# Patient Record
Sex: Female | Born: 1965 | Race: White | Hispanic: No | Marital: Married | Smoking: Current every day smoker
Health system: Southern US, Community
[De-identification: ages and names within clinical notes are randomized; demographics above are authoritative.]

## PROBLEM LIST (undated history)

## (undated) DIAGNOSIS — G8929 Other chronic pain: Secondary | ICD-10-CM

## (undated) DIAGNOSIS — M549 Dorsalgia, unspecified: Secondary | ICD-10-CM

## (undated) HISTORY — PX: TONSILLECTOMY: SUR1361

## (undated) HISTORY — PX: ABDOMINAL HYSTERECTOMY: SHX81

---

## 2004-04-19 ENCOUNTER — Ambulatory Visit: Payer: Self-pay | Admitting: Obstetrics and Gynecology

## 2006-07-07 ENCOUNTER — Ambulatory Visit (HOSPITAL_COMMUNITY): Admission: RE | Admit: 2006-07-07 | Discharge: 2006-07-07 | Payer: Self-pay | Admitting: Preventative Medicine

## 2007-05-26 ENCOUNTER — Emergency Department: Payer: Self-pay | Admitting: Emergency Medicine

## 2007-06-18 ENCOUNTER — Encounter: Payer: Self-pay | Admitting: Physician Assistant

## 2007-07-08 ENCOUNTER — Encounter: Payer: Self-pay | Admitting: Physician Assistant

## 2007-11-19 ENCOUNTER — Ambulatory Visit: Payer: Self-pay | Admitting: Family Medicine

## 2008-02-01 ENCOUNTER — Emergency Department: Payer: Self-pay | Admitting: Emergency Medicine

## 2008-02-01 ENCOUNTER — Other Ambulatory Visit: Payer: Self-pay

## 2015-07-28 ENCOUNTER — Emergency Department: Payer: PRIVATE HEALTH INSURANCE

## 2015-07-28 ENCOUNTER — Emergency Department
Admission: EM | Admit: 2015-07-28 | Discharge: 2015-07-29 | Disposition: A | Discharge status: Home / Self Care | Payer: PRIVATE HEALTH INSURANCE | Attending: Student | Admitting: Student

## 2015-07-28 DIAGNOSIS — R079 Chest pain, unspecified: Secondary | ICD-10-CM | POA: Insufficient documentation

## 2015-07-28 DIAGNOSIS — F17210 Nicotine dependence, cigarettes, uncomplicated: Secondary | ICD-10-CM | POA: Insufficient documentation

## 2015-07-28 DIAGNOSIS — M545 Low back pain: Secondary | ICD-10-CM | POA: Diagnosis not present

## 2015-07-28 DIAGNOSIS — R51 Headache: Secondary | ICD-10-CM | POA: Insufficient documentation

## 2015-07-28 DIAGNOSIS — R519 Headache, unspecified: Secondary | ICD-10-CM

## 2015-07-28 DIAGNOSIS — B349 Viral infection, unspecified: Secondary | ICD-10-CM | POA: Diagnosis not present

## 2015-07-28 DIAGNOSIS — R05 Cough: Secondary | ICD-10-CM | POA: Diagnosis not present

## 2015-07-28 DIAGNOSIS — R059 Cough, unspecified: Secondary | ICD-10-CM

## 2015-07-28 LAB — RAPID INFLUENZA A&B ANTIGENS
Influenza A (ARMC): NEGATIVE
Influenza B (ARMC): NEGATIVE

## 2015-07-28 LAB — URINALYSIS COMPLETE WITH MICROSCOPIC (ARMC ONLY)
BACTERIA UA: NONE SEEN
Bilirubin Urine: NEGATIVE
Glucose, UA: NEGATIVE mg/dL
Hgb urine dipstick: NEGATIVE
KETONES UR: NEGATIVE mg/dL
NITRITE: NEGATIVE
PH: 6 (ref 5.0–8.0)
PROTEIN: NEGATIVE mg/dL
SPECIFIC GRAVITY, URINE: 1.016 (ref 1.005–1.030)

## 2015-07-28 LAB — BASIC METABOLIC PANEL
ANION GAP: 8 (ref 5–15)
BUN: 16 mg/dL (ref 6–20)
CALCIUM: 9.6 mg/dL (ref 8.9–10.3)
CO2: 25 mmol/L (ref 22–32)
Chloride: 102 mmol/L (ref 101–111)
Creatinine, Ser: 0.9 mg/dL (ref 0.44–1.00)
Glucose, Bld: 93 mg/dL (ref 65–99)
POTASSIUM: 4.2 mmol/L (ref 3.5–5.1)
Sodium: 135 mmol/L (ref 135–145)

## 2015-07-28 LAB — CBC
HEMATOCRIT: 40.5 % (ref 35.0–47.0)
HEMOGLOBIN: 13.7 g/dL (ref 12.0–16.0)
MCH: 31.4 pg (ref 26.0–34.0)
MCHC: 33.8 g/dL (ref 32.0–36.0)
MCV: 92.7 fL (ref 80.0–100.0)
Platelets: 200 10*3/uL (ref 150–440)
RBC: 4.37 MIL/uL (ref 3.80–5.20)
RDW: 13.3 % (ref 11.5–14.5)
WBC: 9.7 10*3/uL (ref 3.6–11.0)

## 2015-07-28 LAB — CK: Total CK: 58 U/L (ref 38–234)

## 2015-07-28 MED ORDER — SODIUM CHLORIDE 0.9 % IV BOLUS (SEPSIS)
1000.0000 mL | Freq: Once | INTRAVENOUS | Status: AC
  Administered 2015-07-28: 1000 mL via INTRAVENOUS

## 2015-07-28 MED ORDER — BENZONATATE 100 MG PO CAPS: 100.0000 mg | ORAL_CAPSULE | Freq: Three times a day (TID) | ORAL | Status: AC | PRN

## 2015-07-28 MED ORDER — OXYCODONE-ACETAMINOPHEN 5-325 MG PO TABS
ORAL_TABLET | ORAL | Status: AC
  Administered 2015-07-28: 1 via ORAL
  Filled 2015-07-28: qty 1

## 2015-07-28 MED ORDER — KETOROLAC TROMETHAMINE 30 MG/ML IJ SOLN
15.0000 mg | Freq: Once | INTRAMUSCULAR | Status: AC
  Administered 2015-07-28: 15 mg via INTRAVENOUS
  Filled 2015-07-28: qty 1

## 2015-07-28 MED ORDER — DIPHENHYDRAMINE HCL 50 MG/ML IJ SOLN
12.5000 mg | Freq: Once | INTRAMUSCULAR | Status: AC
  Administered 2015-07-28: 12.5 mg via INTRAVENOUS
  Filled 2015-07-28: qty 1

## 2015-07-28 MED ORDER — OXYCODONE-ACETAMINOPHEN 5-325 MG PO TABS
1.0000 | ORAL_TABLET | ORAL | Status: AC | PRN
  Administered 2015-07-28 (×2): 1 via ORAL

## 2015-07-28 MED ORDER — METOCLOPRAMIDE HCL 5 MG/ML IJ SOLN
10.0000 mg | Freq: Once | INTRAMUSCULAR | Status: AC
  Administered 2015-07-28: 10 mg via INTRAVENOUS
  Filled 2015-07-28: qty 2

## 2015-07-28 NOTE — ED Notes (Addendum)
Pt transferred to CT via stretcher.  

## 2015-07-28 NOTE — ED Provider Notes (Signed)
[facility] Emergency Department Provider Note  ____________________________________________  Time seen: Approximately 9:40 PM  I have reviewed the triage vital signs and the nursing notes.   HISTORY  Chief Complaint Headache and Fatigue    HPI Kaitlyn Hart is a 50 y.o. female with no chronic medical problem 2 presents for evaluation of multiple complaints, gradual onset today, constant since onset, currently moderate to severe, no modifying factors. The patient reports that she awoke this morning with severe cough and nasal congestion. She now has chest pain with cough. The pain does not radiate into her neck, back or down towards the feet, is not exertional or pleuritic in nature. Her husband has been sick with upper respiratory tract infection symptoms. While she was at work, she was eating lunch when she "felt like I was floating". she did not feel lightheaded, did not faint, did not hit her head or lose consciousness but she overall felt very unwell and soon after developed left-sided headache which was not maximal at onset but is currently severe. She is complaining of myalgias including pain in bilateral legs and her back. She denies fevers but she has had chills. No associated throat. No abdominal pain, vomiting or diarrhea. No neck stiffness. She is having photophobia.   History reviewed. No pertinent past medical history.  There are no active problems to display for this patient.   Past Surgical History  Procedure Laterality Date  . Tonsillectomy    . Abdominal hysterectomy      Current Outpatient Rx  Name  Route  Sig  Dispense  Refill  . benzonatate (TESSALON PERLES) 100 MG capsule   Oral   Take 1 capsule (100 mg total) by mouth 3 (three) times daily as needed for cough.   15 capsule   0     Allergies Review of patient's allergies indicates no known allergies.  No family history on file.  Social History Social History  Substance Use  Topics  . Smoking status: Current Every Day Smoker    Types: Cigarettes  . Smokeless tobacco: None  . Alcohol Use: Yes    Review of Systems Constitutional: No fever, +chills Eyes: No visual changes. ENT: No sore throat. Cardiovascular: + chest pain with cough. Respiratory: Denies shortness of breath. Gastrointestinal: No abdominal pain.  No nausea, no vomiting.  No diarrhea.  No constipation. Genitourinary: Negative for dysuria. Musculoskeletal: Positive for back pain. Skin: Negative for rash. Neurological: Positive for headaches, no focal weakness or numbness.  10-point ROS otherwise negative.  ____________________________________________   PHYSICAL EXAM:  VITAL SIGNS: ED Triage Vitals  Enc Vitals Group     BP 07/28/15 1547 123/67 mmHg     Pulse Rate 07/28/15 1547 86     Resp 07/28/15 1547 18     Temp 07/28/15 1547 98.1 F (36.7 C)     Temp Source 07/28/15 1547 Oral     SpO2 07/28/15 1547 100 %     Weight 07/28/15 1547 170 lb (77.111 kg)     Height 07/28/15 1547  (1.651 m)     Head Cir --      Peak Flow --      Pain Score 07/28/15 1553 7     Pain Loc --      Pain Edu? --      Excl. in GC? --     Constitutional: Alert and oriented. Fatigued but nontoxic appearing and in no acute distress. + frequent dry cough Eyes: Conjunctivae are normal.  PERRL. EOMI. + mild photophobia Head: Atraumatic. Nose: No congestion/rhinnorhea. Mouth/Throat: Mucous membranes are moist.  Oropharynx non-erythematous. Neck: No stridor.  Supple without meningismus. Cardiovascular: Normal rate, regular rhythm. Grossly normal heart sounds.  Good peripheral circulation. Respiratory: Normal respiratory effort.  No retractions. Lungs CTAB. Gastrointestinal: Soft and nontender. No distention.  No CVA tenderness. Genitourinary:  deferred Musculoskeletal: No lower extremity tenderness nor edema.  No joint effusions. No calf swelling, asymmetry, or tenderness. Neurologic:  Normal speech and  language. No gross focal neurologic deficits are appreciated. 5 out of 5 strength in bilateral upper and lower extremities, sensation intact to light touch throughout, cranial nerves II through XII intact. Skin:  Skin is warm, dry and intact. No rash noted. Psychiatric: Mood and affect are normal. Speech and behavior are normal.  ____________________________________________   LABS (all labs ordered are listed, but only abnormal results are displayed)  Labs Reviewed  URINALYSIS COMPLETEWITH MICROSCOPIC ([facility] ONLY) - Abnormal; Notable for the following:    Color, Urine YELLOW (*)    APPearance CLEAR (*)    Leukocytes, UA TRACE (*)    Squamous Epithelial / LPF 0-5 (*)    All other components within normal limits  RAPID INFLUENZA A&B ANTIGENS ([facility] ONLY)  BASIC METABOLIC PANEL  CBC  CK  CBG MONITORING, ED   ____________________________________________  EKG  ED ECG REPORT I, Gayla DossGayle, Kaliope Quinonez A, the attending physician, personally viewed and interpreted this ECG.   Date: 07/28/2015  EKG Time: 15:53  Rate: 87  Rhythm: normal EKG, normal sinus rhythm  Axis: normal  Intervals:none  ST&T Change: No acute ST elevation.  ____________________________________________  RADIOLOGY  CT head IMPRESSION: No acute intracranial pathology.   CXR IMPRESSION: No active cardiopulmonary disease.  ____________________________________________   PROCEDURES  Procedure(s) performed: None  Critical Care performed: No  ____________________________________________   INITIAL IMPRESSION / ASSESSMENT AND PLAN / ED COURSE  Pertinent labs & imaging results that were available during my care of the patient were reviewed by me and considered in my medical decision making (see chart for details).  Kaitlyn Hart is a 50 y.o. female with no chronic medical problem 2 presents for evaluation of multiple complaints in the setting of likely viral syndrome. On exam she appears nontoxic but she does  have mild photophobia. She has an intact neurological exam. Neck is supple and I doubt meningismus. Her most salient complaint is of left sided headache and she does not have any history of headaches. We'll send basic labs, obtain CT head, chest x-ray and treat with migraine cocktail. Reassess for disposition. EKG reassuring and I suspect her chest pain with cough is musculoskeletal in nature. Not consistent with acute aortic dissection or PE.  ----------------------------------------- 11:44 PM on 07/28/2015 ----------------------------------------- Labs reviewed. CBC, BMP, CK unremarkable. Negative flu however suspect the patient's symptoms today are secondary to flulike illness. Urinalysis not consistent with infection. CT head and chest x-ray showed no acute pathology. Patient reports that her headache has significantly improved at this time. Doubt subarachnoid hemorrhage. Prior to migraine cocktail she rated pain at 9 out of 10. She now reports that it is a 1 out of 10.  She reports she  Feels much better. Her vital signs are stable. We discussed symptomatic supportive care, return precautions, need for close follow-up and she is comfortable with the discharge plan. DC home.  ____________________________________________   FINAL CLINICAL IMPRESSION(S) / ED DIAGNOSES  Final diagnoses:  Viral syndrome  Acute nonintractable headache, unspecified headache type  Cough  Gayla Doss, MD 07/28/15 [phone]

## 2015-07-28 NOTE — ED Notes (Signed)
Called lab to add on CK. They stated they would run the CK.

## 2015-07-28 NOTE — ED Notes (Signed)
Pt c/o sudden onset HA with photophobia while eating Lunch at work, states just before the HA she had diaphoresis.. C/o chills\sweats with nausea...  Denies vomiting, numbness or dizziness.. Denies Hx of migraines

## 2015-07-28 NOTE — ED Notes (Signed)
Pt reports a sudden headache around 1330, pt reports sudden onset of generalized weakness with tremors

## 2015-07-28 NOTE — ED Notes (Addendum)
Pt states "I got sick at work, I felt like I was floating." Husband states she didn't black out but she doesn't remember how how she got back to the room from lunch. Pt states sweats, nauseous  But no vomiting. Pt states headache, denies fall. Pt denies fever. Pt states husband in room is sick. Pt states cough, back and chest pain along with headache. February 20th pt had shingles.

## 2016-03-31 ENCOUNTER — Emergency Department: Payer: Worker's Compensation

## 2016-03-31 ENCOUNTER — Encounter: Payer: Self-pay | Admitting: *Deleted

## 2016-03-31 DIAGNOSIS — S60212A Contusion of left wrist, initial encounter: Secondary | ICD-10-CM | POA: Diagnosis not present

## 2016-03-31 DIAGNOSIS — S6992XA Unspecified injury of left wrist, hand and finger(s), initial encounter: Secondary | ICD-10-CM | POA: Diagnosis present

## 2016-03-31 DIAGNOSIS — S80212A Abrasion, left knee, initial encounter: Secondary | ICD-10-CM | POA: Insufficient documentation

## 2016-03-31 DIAGNOSIS — W010XXA Fall on same level from slipping, tripping and stumbling without subsequent striking against object, initial encounter: Secondary | ICD-10-CM | POA: Insufficient documentation

## 2016-03-31 DIAGNOSIS — Y9259 Other trade areas as the place of occurrence of the external cause: Secondary | ICD-10-CM | POA: Insufficient documentation

## 2016-03-31 DIAGNOSIS — Y9389 Activity, other specified: Secondary | ICD-10-CM | POA: Diagnosis not present

## 2016-03-31 DIAGNOSIS — F17210 Nicotine dependence, cigarettes, uncomplicated: Secondary | ICD-10-CM | POA: Insufficient documentation

## 2016-03-31 DIAGNOSIS — Y990 Civilian activity done for income or pay: Secondary | ICD-10-CM | POA: Diagnosis not present

## 2016-03-31 NOTE — ED Triage Notes (Signed)
Pt has left wrist and left knee pain after falling at work tonight at [employer].  Pt has abrasion to left knee   States painful to ambulate.  Pt states WC.  Pt alert.

## 2016-04-01 ENCOUNTER — Emergency Department
Admission: EM | Admit: 2016-04-01 | Discharge: 2016-04-01 | Disposition: A | Discharge status: Home / Self Care | Payer: Worker's Compensation | Attending: Emergency Medicine | Admitting: Emergency Medicine

## 2016-04-01 DIAGNOSIS — S80212A Abrasion, left knee, initial encounter: Secondary | ICD-10-CM

## 2016-04-01 DIAGNOSIS — S60212A Contusion of left wrist, initial encounter: Secondary | ICD-10-CM

## 2016-04-01 MED ORDER — LIDOCAINE-EPINEPHRINE-TETRACAINE (LET) SOLUTION
NASAL | Status: AC
  Filled 2016-04-01: qty 3

## 2016-04-01 MED ORDER — LIDOCAINE VISCOUS 2 % MT SOLN
15.0000 mL | Freq: Once | OROMUCOSAL | Status: AC
  Administered 2016-04-01: 15 mL via OROMUCOSAL

## 2016-04-01 MED ORDER — LIDOCAINE VISCOUS 2 % MT SOLN
OROMUCOSAL | Status: AC
  Administered 2016-04-01: 15 mL via OROMUCOSAL
  Filled 2016-04-01: qty 15

## 2016-04-01 MED ORDER — OXYCODONE-ACETAMINOPHEN 5-325 MG PO TABS
ORAL_TABLET | ORAL | Status: AC
  Administered 2016-04-01: 1 via ORAL
  Filled 2016-04-01: qty 1

## 2016-04-01 MED ORDER — OXYCODONE-ACETAMINOPHEN 5-325 MG PO TABS: 1.0000 | ORAL_TABLET | ORAL | 0 refills | Status: AC | PRN

## 2016-04-01 MED ORDER — OXYCODONE-ACETAMINOPHEN 5-325 MG PO TABS
1.0000 | ORAL_TABLET | Freq: Once | ORAL | Status: AC
  Administered 2016-04-01: 1 via ORAL

## 2016-04-01 NOTE — ED Notes (Signed)
Pt had fall at work to left side, abrasion noted to left knee, pt holding left wrist. No deformity ntoed.

## 2016-04-01 NOTE — ED Provider Notes (Signed)
[facility] Emergency Department Provider Note    First MD Initiated Contact with Patient 04/01/16 [phone]     (approximate)  I have reviewed the triage vital signs and the nursing notes.   HISTORY  Chief Complaint Wrist Pain and Fall   HPI Kaitlyn Hart is a 50 y.o. female presents with history of slip and fall while at work [employer]. Patient complains of resultant left wrist and left knee pain that is currently 9 out of 10. Patient admits to abrasion to the left knee. Pain is worse with ambulation. Patient states that left wrist pain is worsened with movement.   Past medical history No pertinent past medical history There are no active problems to display for this patient.   Past Surgical History:  Procedure Laterality Date  . ABDOMINAL HYSTERECTOMY    . TONSILLECTOMY      Prior to Admission medications   Medication Sig Start Date End Date Taking? Authorizing Provider  benzonatate (TESSALON PERLES) 100 MG capsule Take 1 capsule (100 mg total) by mouth 3 (three) times daily as needed for cough. 07/28/15   Gayla DossEryka A Gayle, MD    Allergies No known drug allergies No family history on file.  Social History Social History  Substance Use Topics  . Smoking status: Current Every Day Smoker    Types: Cigarettes  . Smokeless tobacco: Never Used  . Alcohol use Yes    Review of Systems Constitutional: No fever/chills Eyes: No visual changes. ENT: No sore throat. Cardiovascular: Denies chest pain. Respiratory: Denies shortness of breath. Gastrointestinal: No abdominal pain.  No nausea, no vomiting.  No diarrhea.  No constipation. Genitourinary: Negative for dysuria. Musculoskeletal: Negative for back pain.Positive for left wrist and knee pain Skin: Negative for rash. Neurological: Negative for headaches, focal weakness or numbness.  10-point ROS otherwise negative.  ____________________________________________   PHYSICAL  EXAM:  VITAL SIGNS: ED Triage Vitals  Enc Vitals Group     BP 03/31/16 2323 (!) 160/90     Pulse Rate 03/31/16 2323 84     Resp 03/31/16 2323 20     Temp 03/31/16 2323 98.7 F (37.1 C)     Temp Source 03/31/16 2323 Oral     SpO2 03/31/16 2323 99 %     Weight 03/31/16 2324 175 lb (79.4 kg)     Height 03/31/16 2324 5\' 5"  (1.651 m)     Head Circumference --      Peak Flow --      Pain Score 03/31/16 2324 8     Pain Loc --      Pain Edu? --      Excl. in GC? --     Constitutional: Alert and oriented. Apparent discomfort Eyes: Conjunctivae are normal. PERRL. EOMI. Head: Atraumatic. Ears:  Healthy appearing ear canals and TMs bilaterally Nose: No congestion/rhinnorhea. Mouth/Throat: Mucous membranes are moist.  Oropharynx non-erythematous. Neck: No stridor.   Cardiovascular: Normal rate, regular rhythm. Good peripheral circulation. Grossly normal heart sounds. Respiratory: Normal respiratory effort.  No retractions. Lungs CTAB. Gastrointestinal: Soft and nontender. No distention.  Musculoskeletal: No pain with active and passive range of motion of the left knee. Pain with active and passive range of motion in all directions of the left wrist. Neurologic:  Normal speech and language. No gross focal neurologic deficits are appreciated.  Skin:  Quarter size abrasion to the patellar region of the left knee Psychiatric: Mood and affect are normal. Speech and behavior are normal.  RADIOLOGY I, [place] N Ivar Domangue, personally viewed and evaluated these images (plain radiographs) as part of my medical decision making, as well as reviewing the written report by the radiologist.  Dg Wrist Complete Left  Result Date: 04/01/2016 CLINICAL DATA:  Left wrist and left knee pain after a fall tonight. EXAM: LEFT WRIST - COMPLETE 3+ VIEW COMPARISON:  None. FINDINGS: There is no evidence of fracture or dislocation. There is no evidence of arthropathy or other focal bone abnormality. Soft tissues are  unremarkable. IMPRESSION: Negative. Electronically Signed   By: Burman NievesWilliam  Stevens M.D.   On: 04/01/2016 00:17   Dg Knee Complete 4 Views Left  Result Date: 04/01/2016 CLINICAL DATA:  Left knee pain after a fall today. EXAM: LEFT KNEE - COMPLETE 4+ VIEW COMPARISON:  None. FINDINGS: No evidence of fracture, dislocation, or joint effusion. No evidence of arthropathy or other focal bone abnormality. Soft tissues are unremarkable. IMPRESSION: Negative. Electronically Signed   By: Burman NievesWilliam  Stevens M.D.   On: 04/01/2016 00:18     Procedures    INITIAL IMPRESSION / ASSESSMENT AND PLAN / ED COURSE  Pertinent labs & imaging results that were available during my care of the patient were reviewed by me and considered in my medical decision making (see chart for details).     Clinical Course as of Apr 02 203  Fri Apr 01, 2016  0029 DG Wrist Complete Left [RB]    Clinical Course User Index [RB] Darci Currentandolph N Kathia Covington, MD    ____________________________________________  FINAL CLINICAL IMPRESSION(S) / ED DIAGNOSES  Final diagnoses:  Contusion of left wrist, initial encounter  Abrasion of left knee, initial encounter     MEDICATIONS GIVEN DURING THIS VISIT:  Medications  lidocaine-EPINEPHrine-tetracaine (LET) solution (not administered)  oxyCODONE-acetaminophen (PERCOCET/ROXICET) 5-325 MG per tablet 1 tablet (1 tablet Oral Given 04/01/16 0203)  lidocaine (XYLOCAINE) 2 % viscous mouth solution 15 mL (15 mLs Mouth/Throat Given 04/01/16 0203)     NEW OUTPATIENT MEDICATIONS STARTED DURING THIS VISIT:  New Prescriptions   No medications on file    Modified Medications   No medications on file    Discontinued Medications   No medications on file     Note:  This document was prepared using Dragon voice recognition software and may include unintentional dictation errors.    Darci Currentandolph N Daneka Lantigua, MD 04/01/16 [phone]

## 2016-05-06 ENCOUNTER — Other Ambulatory Visit: Payer: Self-pay | Admitting: Surgery

## 2016-05-06 DIAGNOSIS — S83232D Complex tear of medial meniscus, current injury, left knee, subsequent encounter: Secondary | ICD-10-CM

## 2016-05-11 ENCOUNTER — Ambulatory Visit: Payer: PRIVATE HEALTH INSURANCE

## 2016-05-24 ENCOUNTER — Ambulatory Visit
Admission: RE | Admit: 2016-05-24 | Discharge: 2016-05-24 | Disposition: A | Discharge status: Home / Self Care | Payer: Worker's Compensation | Source: Ambulatory Visit | Attending: Surgery | Admitting: Surgery

## 2016-05-24 DIAGNOSIS — S838X2D Sprain of other specified parts of left knee, subsequent encounter: Secondary | ICD-10-CM | POA: Diagnosis present

## 2016-05-24 DIAGNOSIS — X58XXXD Exposure to other specified factors, subsequent encounter: Secondary | ICD-10-CM | POA: Insufficient documentation

## 2016-05-24 DIAGNOSIS — S83232D Complex tear of medial meniscus, current injury, left knee, subsequent encounter: Secondary | ICD-10-CM

## 2016-07-25 ENCOUNTER — Other Ambulatory Visit: Payer: Self-pay | Admitting: Surgery

## 2016-07-25 DIAGNOSIS — S53402D Unspecified sprain of left elbow, subsequent encounter: Secondary | ICD-10-CM

## 2016-07-31 ENCOUNTER — Ambulatory Visit
Admission: RE | Admit: 2016-07-31 | Discharge: 2016-07-31 | Disposition: A | Discharge status: Home / Self Care | Payer: Worker's Compensation | Source: Ambulatory Visit | Attending: Surgery | Admitting: Surgery

## 2016-07-31 DIAGNOSIS — S53402D Unspecified sprain of left elbow, subsequent encounter: Secondary | ICD-10-CM

## 2016-09-13 ENCOUNTER — Telehealth: Payer: Self-pay | Admitting: Surgery

## 2016-09-13 ENCOUNTER — Other Ambulatory Visit: Payer: Self-pay | Admitting: Surgery

## 2016-09-14 NOTE — Telephone Encounter (Signed)
Error. Wrong patient.

## 2016-09-29 NOTE — Progress Notes (Signed)
This letter was cancelled as I completed it through my office EMR system.

## 2016-11-03 ENCOUNTER — Other Ambulatory Visit (HOSPITAL_COMMUNITY): Payer: Self-pay | Admitting: Surgery

## 2016-11-03 DIAGNOSIS — S83412S Sprain of medial collateral ligament of left knee, sequela: Secondary | ICD-10-CM

## 2016-11-08 ENCOUNTER — Other Ambulatory Visit (HOSPITAL_COMMUNITY): Payer: Self-pay | Admitting: Surgery

## 2016-11-08 DIAGNOSIS — M5417 Radiculopathy, lumbosacral region: Secondary | ICD-10-CM

## 2016-11-10 ENCOUNTER — Ambulatory Visit (HOSPITAL_COMMUNITY)
Admission: RE | Admit: 2016-11-10 | Discharge: 2016-11-10 | Disposition: A | Discharge status: Home / Self Care | Payer: Worker's Compensation | Source: Ambulatory Visit | Attending: Surgery | Admitting: Surgery

## 2016-11-10 DIAGNOSIS — M5136 Other intervertebral disc degeneration, lumbar region: Secondary | ICD-10-CM | POA: Insufficient documentation

## 2016-11-10 DIAGNOSIS — M25462 Effusion, left knee: Secondary | ICD-10-CM | POA: Diagnosis present

## 2016-11-10 DIAGNOSIS — M5417 Radiculopathy, lumbosacral region: Secondary | ICD-10-CM

## 2016-11-10 DIAGNOSIS — X58XXXS Exposure to other specified factors, sequela: Secondary | ICD-10-CM | POA: Insufficient documentation

## 2016-11-10 DIAGNOSIS — S83412S Sprain of medial collateral ligament of left knee, sequela: Secondary | ICD-10-CM | POA: Insufficient documentation

## 2016-11-10 DIAGNOSIS — M47816 Spondylosis without myelopathy or radiculopathy, lumbar region: Secondary | ICD-10-CM | POA: Diagnosis not present

## 2018-07-28 ENCOUNTER — Other Ambulatory Visit: Payer: Self-pay

## 2018-07-28 ENCOUNTER — Encounter (HOSPITAL_COMMUNITY): Payer: Self-pay | Admitting: Emergency Medicine

## 2018-07-28 ENCOUNTER — Emergency Department (HOSPITAL_COMMUNITY)
Admission: EM | Admit: 2018-07-28 | Discharge: 2018-07-28 | Disposition: A | Discharge status: Home / Self Care | Payer: Self-pay | Attending: Emergency Medicine | Admitting: Emergency Medicine

## 2018-07-28 DIAGNOSIS — M5432 Sciatica, left side: Secondary | ICD-10-CM | POA: Insufficient documentation

## 2018-07-28 DIAGNOSIS — F17210 Nicotine dependence, cigarettes, uncomplicated: Secondary | ICD-10-CM | POA: Insufficient documentation

## 2018-07-28 DIAGNOSIS — M5431 Sciatica, right side: Secondary | ICD-10-CM | POA: Insufficient documentation

## 2018-07-28 HISTORY — DX: Dorsalgia, unspecified: M54.9

## 2018-07-28 HISTORY — DX: Other chronic pain: G89.29

## 2018-07-28 MED ORDER — KETOROLAC TROMETHAMINE 60 MG/2ML IM SOLN
30.0000 mg | Freq: Once | INTRAMUSCULAR | Status: AC
  Administered 2018-07-28: 30 mg via INTRAMUSCULAR
  Filled 2018-07-28: qty 2

## 2018-07-28 MED ORDER — PREDNISONE 50 MG PO TABS
60.0000 mg | ORAL_TABLET | Freq: Once | ORAL | Status: AC
  Administered 2018-07-28: 60 mg via ORAL
  Filled 2018-07-28: qty 1

## 2018-07-28 MED ORDER — PREDNISONE 20 MG PO TABS: ORAL_TABLET | ORAL | 0 refills | Status: AC

## 2018-07-28 MED ORDER — IBUPROFEN 600 MG PO TABS: 600.0000 mg | ORAL_TABLET | Freq: Four times a day (QID) | ORAL | 0 refills | Status: DC | PRN

## 2018-07-28 MED ORDER — CYCLOBENZAPRINE HCL 10 MG PO TABS: 10.0000 mg | ORAL_TABLET | Freq: Two times a day (BID) | ORAL | 0 refills | Status: DC | PRN

## 2018-07-28 MED ORDER — CYCLOBENZAPRINE HCL 10 MG PO TABS: 10.0000 mg | ORAL_TABLET | Freq: Two times a day (BID) | ORAL | 0 refills | Status: AC | PRN

## 2018-07-28 MED ORDER — IBUPROFEN 600 MG PO TABS: 600.0000 mg | ORAL_TABLET | Freq: Four times a day (QID) | ORAL | 0 refills | Status: AC | PRN

## 2018-07-28 NOTE — ED Provider Notes (Signed)
[facility] EMERGENCY DEPARTMENT Provider Note   CSN: [id number] Arrival date & time: 07/28/18  1400    History   Chief Complaint Chief Complaint  Patient presents with  . Back Pain    HPI Kaitlyn Hart is a 53 y.o. female.     The history is provided by the patient and medical records. No language interpreter was used.    53 year old female with history of chronic back pain presenting complaining of worsening back pain.  Patient report for the past 2 to 3 years she has had recurrent lower back pain with pain radiates down to her left leg down to the foot.  She has been receiving several treatments labs as well as follow-up with the pain clinic.  She does have a neurosurgeon who had a recent MRI of her spine.  She is scheduled to have some injection to her spine to alleviate his symptoms.  For the past 3 to 4 days she endorsed worsening pain now radiates to her right buttock.  Pain is sharp, shooting, worsening with certain position and with sitting.  Pain is moderate to severe keeping her up at night.  She does not complain of any associated fever or chills, no bowel or bladder incontinence or saddle anesthesia.  No history of IV drug use active cancer.  She was prescribed gabapentin to use as needed which has not helped.  She did recall having to drive while sitting in an awkward position for an extended period of time which aggravates her back.  Past Medical History:  Diagnosis Date  . Chronic back pain greater than 3 months duration     There are no active problems to display for this patient.   Past Surgical History:  Procedure Laterality Date  . ABDOMINAL HYSTERECTOMY    . TONSILLECTOMY       OB History   No obstetric history on file.      Home Medications    Prior to Admission medications   Medication Sig Start Date End Date Taking? Authorizing Provider  benzonatate (TESSALON PERLES) 100 MG capsule Take 1 capsule (100 mg total) by mouth 3 (three) times daily  as needed for cough. 07/28/15   Gayla Doss, MD  oxyCODONE-acetaminophen (ROXICET) 5-325 MG tablet Take 1 tablet by mouth every 4 (four) hours as needed for severe pain. 04/01/16   Darci Current, MD    Family History No family history on file.  Social History Social History   Tobacco Use  . Smoking status: Current Every Day Smoker    Packs/day: 1.00    Types: Cigarettes  . Smokeless tobacco: Never Used  Substance Use Topics  . Alcohol use: Yes  . Drug use: Not on file     Allergies   Patient has no known allergies.   Review of Systems Review of Systems  All other systems reviewed and are negative.    Physical Exam Updated Vital Signs BP (!) 149/83 (BP Location: Right Arm)   Pulse 89   Temp 98.1 F (36.7 C) (Oral)   Resp 16   Ht 5\' 5"  (1.651 m)   Wt 97.5 kg   SpO2 100%   BMI 35.78 kg/m   Physical Exam Vitals signs and nursing note reviewed.  Constitutional:      General: She is not in acute distress.    Appearance: She is well-developed.  HENT:     Head: Atraumatic.  Eyes:     Conjunctiva/sclera: Conjunctivae normal.  Neck:  Musculoskeletal: Neck supple.  Cardiovascular:     Rate and Rhythm: Normal rate and regular rhythm.  Pulmonary:     Effort: Pulmonary effort is normal.     Breath sounds: Normal breath sounds.  Abdominal:     Palpations: Abdomen is soft.     Tenderness: There is no abdominal tenderness.  Musculoskeletal:        General: Tenderness (Tenderness along lumbar spine as well as bilateral paraspinal muscle on palpation.  Tenderness to both lumbosacral region on palpation.  Positive straight leg raise bilaterally) present.  Skin:    Findings: No rash.  Neurological:     Mental Status: She is alert.     Comments: Patellar deep tendon reflex intact bilaterally no foot drops.  Able to ambulate.      ED Treatments / Results  Labs (all labs ordered are listed, but only abnormal results are displayed) Labs Reviewed - No data  to display  EKG None  Radiology No results found.  Procedures Procedures (including critical care time)  Medications Ordered in ED Medications  ketorolac (TORADOL) injection 30 mg (30 mg Intramuscular Given 07/28/18 1531)  predniSONE (DELTASONE) tablet 60 mg (60 mg Oral Given 07/28/18 1530)     Initial Impression / Assessment and Plan / ED Course  I have reviewed the triage vital signs and the nursing notes.  Pertinent labs & imaging results that were available during my care of the patient were reviewed by me and considered in my medical decision making (see chart for details).        BP (!) 149/83 (BP Location: Right Arm)   Pulse 89   Temp 98.1 F (36.7 C) (Oral)   Resp 16   Ht 5\' 5"  (1.651 m)   Wt 97.5 kg   SpO2 100%   BMI 35.78 kg/m    Final Clinical Impressions(s) / ED Diagnoses   Final diagnoses:  Bilateral sciatica    ED Discharge Orders         Ordered    predniSONE (DELTASONE) 20 MG tablet     07/28/18 1548    cyclobenzaprine (FLEXERIL) 10 MG tablet  2 times daily PRN     07/28/18 1548    ibuprofen (ADVIL,MOTRIN) 600 MG tablet  Every 6 hours PRN     07/28/18 1548         Patient presenting with acute onset back pain. No evidence for cauda equina, spinal infection, bony injury, other significant pathology.  The patient did not have any urinary incontinence, bowel incontinence, saddle anesthesia, fever, or weight loss that would warrant imaging. Patient was provided with toradol and prednisone in ED.   Provided prescription for pain medication and muscle relaxant. Patient was advised to followup with primary physician in 3-7 days if continues to have back pain. Advised to return to ER if signs of cauda equina or spinal infection including loss of bowel or bladder function, peripheral numbness/weakness/tingling, significant fevers, or other concerns. At time of discharge patient able to ambulate and vitals stable.  Impression: Back pain   Plan:     * Discharge from ED   *  Advised Ibuprofen 600-800mg  q12-16hr prn for pain and inflammation.      * Prescribed Flexeril 10mg  TID PRN Spasm   * Advised Pt on supportive therapies, including resting on a firm mattress (on back w/ knees raised on a pillow or on side w/ knees bent at 45-90deg), professional massage, relaxation techniques, heat application for 20-85min q4-5hr, Wt loss, ergonomic  therapies, and refraining from lifting heavy objects.   * Instructed Pt on low back pain ROM exercises.   * F/U with PCP in 3-7 days if pain continues.  Informed to return to ER if has new or worsening symptoms. Expressed understanding of and agreement with plan and all questions answered.    Fayrene Helper, PA-C 07/28/18 1549    Samuel Jester, DO 07/29/18 1330

## 2018-07-28 NOTE — ED Notes (Signed)
Patient given discharge instruction, verbalized understand. IV removed, band aid applied. Patient ambulatory out of the department.  

## 2018-07-28 NOTE — ED Triage Notes (Signed)
Chronic back pain   Followed by Dr Margo Aye   Has seen Neuro surgeon- Has Rx Gabapentin but has not taken as prescribed  Has been to pain clinic previously "but that did not work"  Here for back pain leg pain   Denies incontinence

## 2018-12-11 IMAGING — MR MR ELBOW*L* W/O CM
5 series · 40 of 40 positions shown · non-contrast
Comparison: None.

CLINICAL DATA: Left elbow pain and popping since a fall 4 months
ago.

EXAM:
MRI OF THE LEFT ELBOW WITHOUT CONTRAST
TECHNIQUE: Multiplanar, multisequence MR imaging of the elbow was performed. No
intravenous contrast was administered.

[Series 3: T1 · axial · left · 3.0mm · 0.44mm/px · z∈[+21,+93]mm · 8 of 23 slices shown]
[im 1/23]
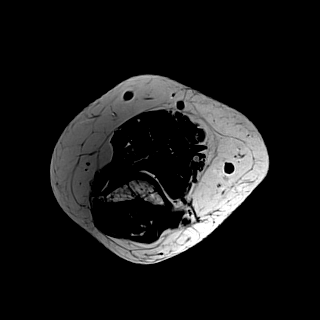
[im 4/23]
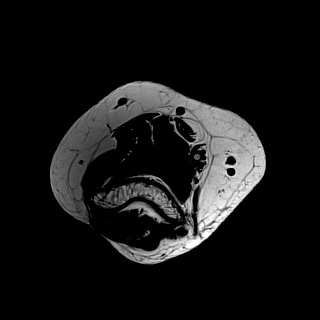
[im 7/23]
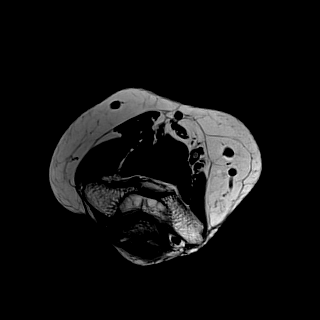
[im 10/23]
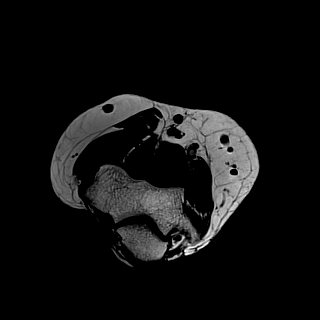
[im 13/23]
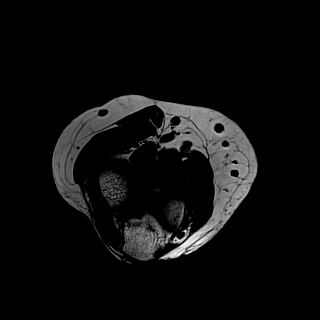
[im 16/23]
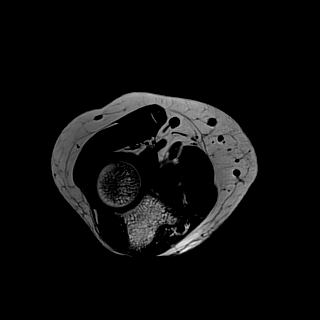
[im 19/23]
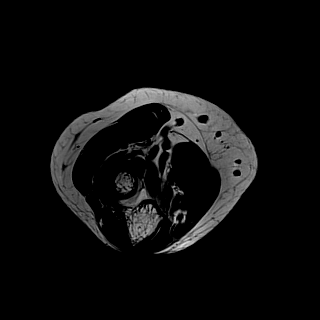
[im 23/23]
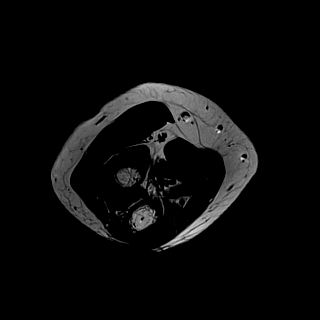

[Series 4: T2 fat-sat · axial · left · 3.0mm · 0.44mm/px · z∈[+21,+93]mm · 8 of 23 slices shown (1 of 2)]
[im 1/23]
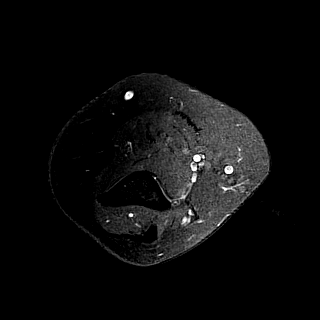
[im 4/23]
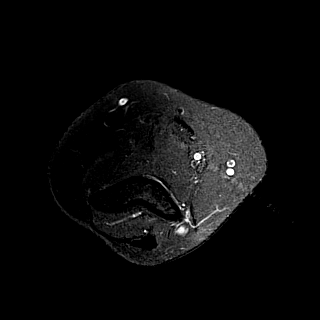
[im 7/23]
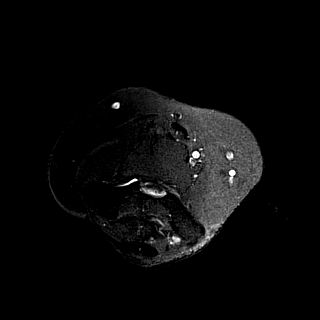
[im 10/23]
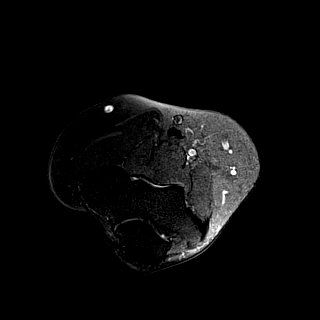
[im 13/23]
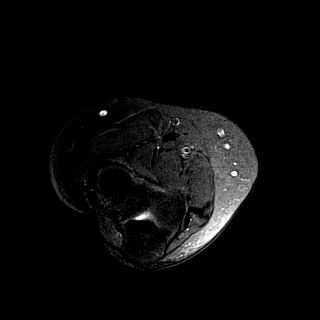
[im 16/23]
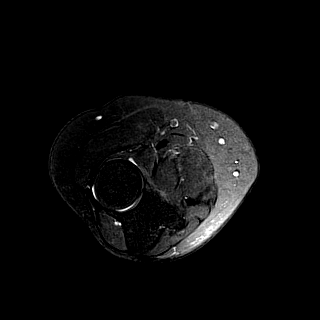
[im 19/23]
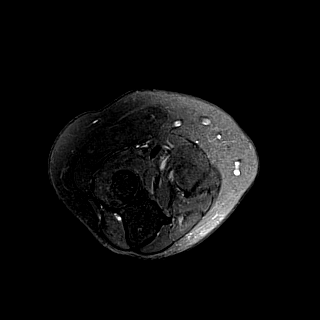
[im 23/23]
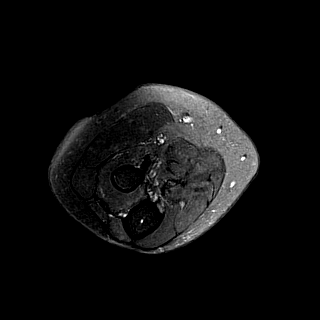

[Series 100: sag · coronal · left · 3.0mm · 0.36mm/px · 8 of 24 slices shown]
[im 1/24]
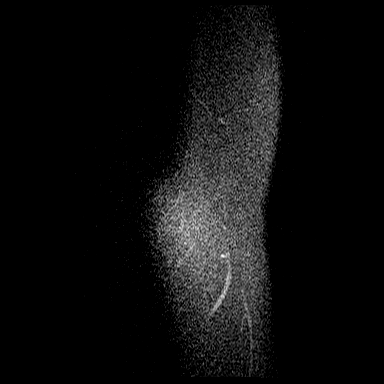
[im 4/24]
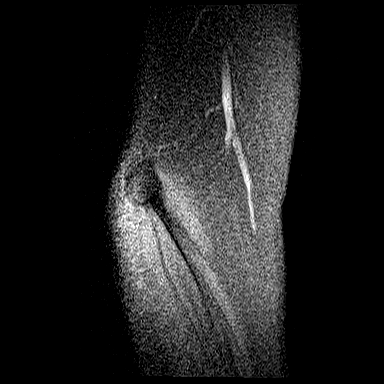
[im 7/24]
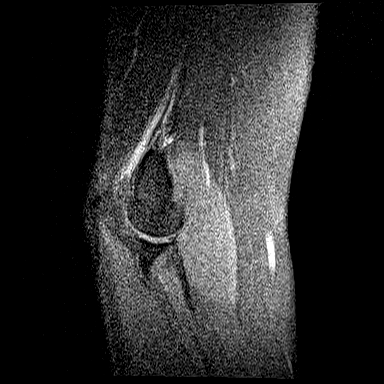
[im 10/24]
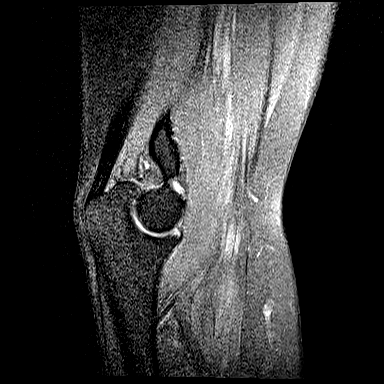
[im 14/24]
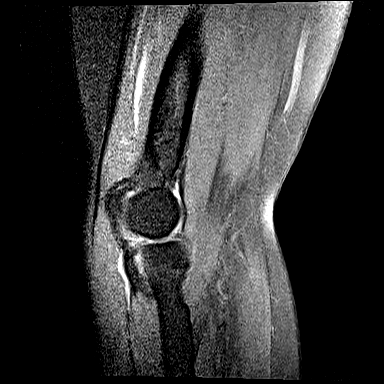
[im 17/24]
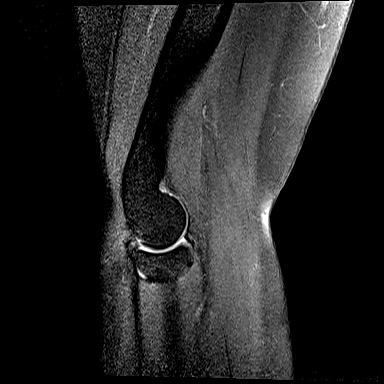
[im 20/24]
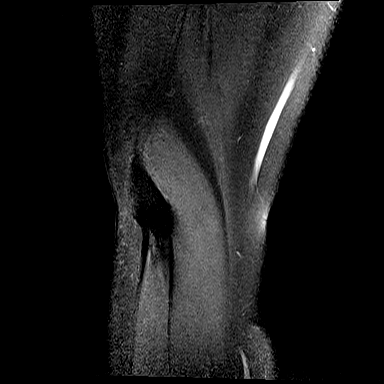
[im 24/24]
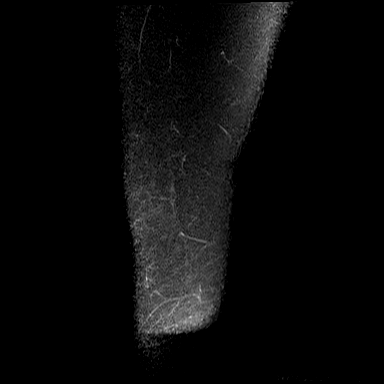

[Series 102: STIR · sagittal · left · 3.0mm · 0.55mm/px · 8 of 23 slices shown]
[im 1/23]
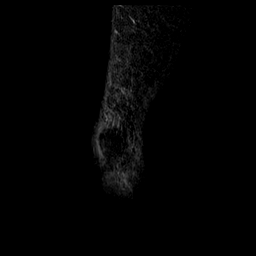
[im 4/23]
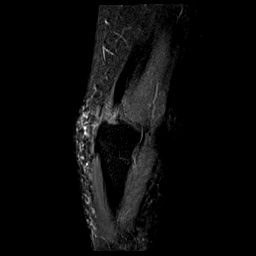
[im 7/23]
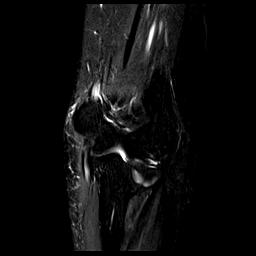
[im 10/23]
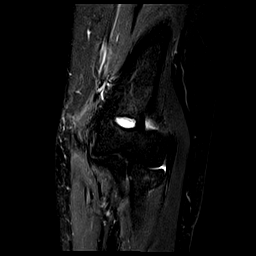
[im 13/23]
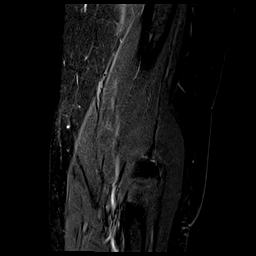
[im 16/23]
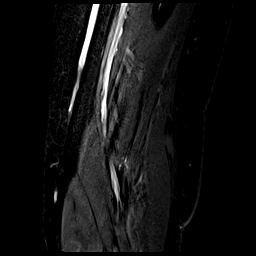
[im 19/23]
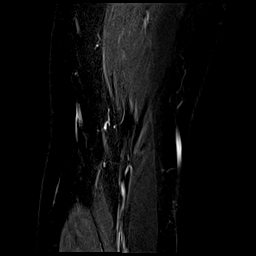
[im 23/23]
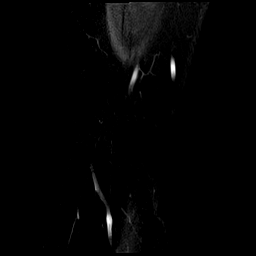

[Series 103: T2 fat-sat · sagittal · left · 3.0mm · 0.44mm/px · 8 of 23 slices shown (2 of 2)]
[im 1/23]
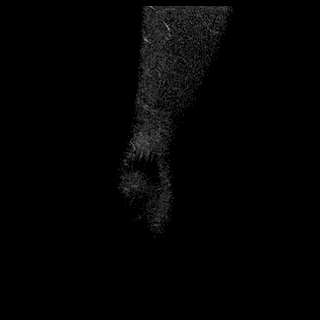
[im 4/23]
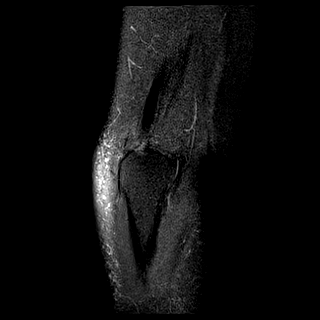
[im 7/23]
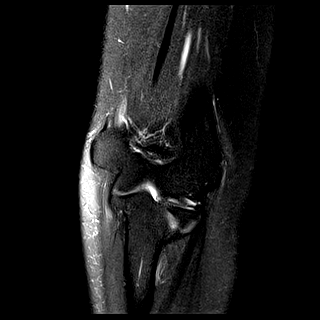
[im 10/23]
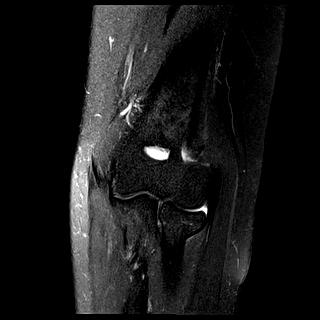
[im 13/23]
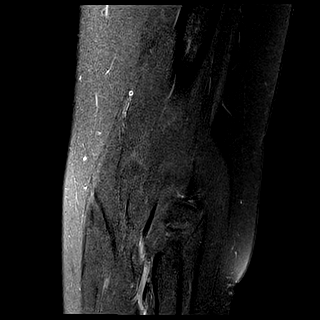
[im 16/23]
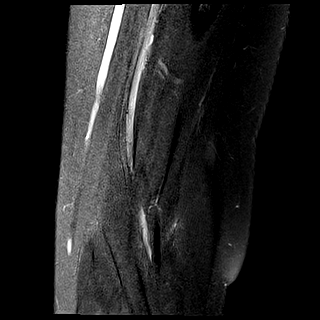
[im 19/23]
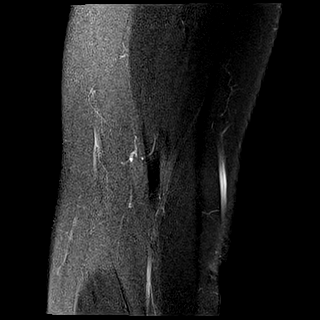
[im 23/23]
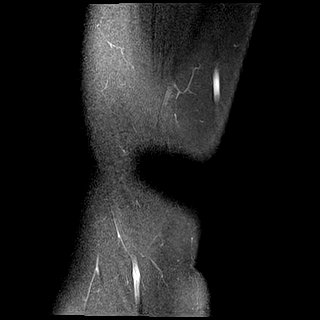

[40 of 40 positions shown; findings below may reference images not displayed]

FINDINGS: TENDONS

Common forearm flexor origin: Intact and normal in appearance.

Common forearm extensor origin: Intact and normal in appearance.

Biceps: Intact and normal in appearance.

Triceps: Intact and normal in appearance.

LIGAMENTS

Medial stabilizers: Intact and normal in appearance.

Lateral stabilizers:  Intact and normal in appearance.

Cartilage: Appears normal.

Joint: Appears normal.  No effusion.

Cubital tunnel: Normal.

Bones: Normal marrow signal throughout.
IMPRESSION: Normal MRI left elbow.
